# Patient Record
Sex: Female | Born: 1970 | Race: White | Hispanic: No | Smoking: Current every day smoker
Health system: Southern US, Community
[De-identification: ages and names within clinical notes are randomized; demographics above are authoritative.]

## PROBLEM LIST (undated history)

## (undated) DIAGNOSIS — E2839 Other primary ovarian failure: Secondary | ICD-10-CM

## (undated) DIAGNOSIS — N200 Calculus of kidney: Secondary | ICD-10-CM

## (undated) DIAGNOSIS — M810 Age-related osteoporosis without current pathological fracture: Secondary | ICD-10-CM

## (undated) DIAGNOSIS — N390 Urinary tract infection, site not specified: Secondary | ICD-10-CM

## (undated) HISTORY — PX: HERNIA REPAIR: SHX51

## (undated) HISTORY — PX: LITHOTRIPSY: SUR834

---

## 2016-04-20 ENCOUNTER — Emergency Department: Payer: BLUE CROSS/BLUE SHIELD

## 2016-04-20 ENCOUNTER — Emergency Department
Admission: EM | Admit: 2016-04-20 | Discharge: 2016-04-20 | Disposition: A | Discharge status: Home / Self Care | Payer: BLUE CROSS/BLUE SHIELD | Attending: Emergency Medicine | Admitting: Emergency Medicine

## 2016-04-20 DIAGNOSIS — R0781 Pleurodynia: Secondary | ICD-10-CM | POA: Diagnosis not present

## 2016-04-20 DIAGNOSIS — R110 Nausea: Secondary | ICD-10-CM | POA: Diagnosis not present

## 2016-04-20 DIAGNOSIS — F17200 Nicotine dependence, unspecified, uncomplicated: Secondary | ICD-10-CM | POA: Insufficient documentation

## 2016-04-20 DIAGNOSIS — R109 Unspecified abdominal pain: Secondary | ICD-10-CM | POA: Diagnosis present

## 2016-04-20 DIAGNOSIS — R1011 Right upper quadrant pain: Secondary | ICD-10-CM | POA: Diagnosis not present

## 2016-04-20 HISTORY — DX: Age-related osteoporosis without current pathological fracture: M81.0

## 2016-04-20 HISTORY — DX: Other primary ovarian failure: E28.39

## 2016-04-20 HISTORY — DX: Calculus of kidney: N20.0

## 2016-04-20 HISTORY — DX: Urinary tract infection, site not specified: N39.0

## 2016-04-20 LAB — CBC
HCT: 41.7 % (ref 35.0–47.0)
HEMOGLOBIN: 14 g/dL (ref 12.0–16.0)
MCH: 30 pg (ref 26.0–34.0)
MCHC: 33.5 g/dL (ref 32.0–36.0)
MCV: 89.5 fL (ref 80.0–100.0)
Platelets: 188 10*3/uL (ref 150–440)
RBC: 4.66 MIL/uL (ref 3.80–5.20)
RDW: 13.1 % (ref 11.5–14.5)
WBC: 3.9 10*3/uL (ref 3.6–11.0)

## 2016-04-20 LAB — HEPATIC FUNCTION PANEL
ALBUMIN: 4.3 g/dL (ref 3.5–5.0)
ALT: 53 U/L (ref 14–54)
AST: 55 U/L — ABNORMAL HIGH (ref 15–41)
Alkaline Phosphatase: 63 U/L (ref 38–126)
Bilirubin, Direct: <0.1 mg/dL — ABNORMAL LOW (ref 0.1–0.5)
TOTAL PROTEIN: 7.5 g/dL (ref 6.5–8.1)
Total Bilirubin: 0.4 mg/dL (ref 0.3–1.2)

## 2016-04-20 LAB — URINALYSIS, COMPLETE (UACMP) WITH MICROSCOPIC
Bacteria, UA: NONE SEEN
Bilirubin Urine: NEGATIVE
Glucose, UA: NEGATIVE mg/dL
Hgb urine dipstick: NEGATIVE
KETONES UR: 5 mg/dL — AB
Leukocytes, UA: NEGATIVE
Nitrite: NEGATIVE
PROTEIN: NEGATIVE mg/dL
Specific Gravity, Urine: 1.015 (ref 1.005–1.030)
pH: 6 (ref 5.0–8.0)

## 2016-04-20 LAB — BASIC METABOLIC PANEL
ANION GAP: 8 (ref 5–15)
BUN: 6 mg/dL (ref 6–20)
CALCIUM: 9.2 mg/dL (ref 8.9–10.3)
CO2: 27 mmol/L (ref 22–32)
Chloride: 102 mmol/L (ref 101–111)
Creatinine, Ser: 0.78 mg/dL (ref 0.44–1.00)
Glucose, Bld: 135 mg/dL — ABNORMAL HIGH (ref 65–99)
Potassium: 4 mmol/L (ref 3.5–5.1)
SODIUM: 137 mmol/L (ref 135–145)

## 2016-04-20 LAB — LIPASE, BLOOD: Lipase: 37 U/L (ref 11–51)

## 2016-04-20 LAB — PREGNANCY, URINE: Preg Test, Ur: NEGATIVE

## 2016-04-20 MED ORDER — IOPAMIDOL (ISOVUE-370) INJECTION 76%
75.0000 mL | Freq: Once | INTRAVENOUS | Status: AC | PRN
  Administered 2016-04-20: 75 mL via INTRAVENOUS
  Filled 2016-04-20: qty 75

## 2016-04-20 MED ORDER — ONDANSETRON HCL 4 MG/2ML IJ SOLN
4.0000 mg | Freq: Once | INTRAMUSCULAR | Status: AC
  Administered 2016-04-20: 4 mg via INTRAVENOUS
  Filled 2016-04-20: qty 2

## 2016-04-20 MED ORDER — SODIUM CHLORIDE 0.9 % IV BOLUS (SEPSIS)
1000.0000 mL | Freq: Once | INTRAVENOUS | Status: AC
  Administered 2016-04-20: 1000 mL via INTRAVENOUS

## 2016-04-20 MED ORDER — IBUPROFEN 800 MG PO TABS: 800.0000 mg | ORAL_TABLET | Freq: Three times a day (TID) | ORAL | 0 refills | Status: DC | PRN

## 2016-04-20 MED ORDER — KETOROLAC TROMETHAMINE 30 MG/ML IJ SOLN
30.0000 mg | Freq: Once | INTRAMUSCULAR | Status: AC
  Administered 2016-04-20: 30 mg via INTRAVENOUS
  Filled 2016-04-20: qty 1

## 2016-04-20 MED ORDER — CYCLOBENZAPRINE HCL 5 MG PO TABS: 5.0000 mg | ORAL_TABLET | Freq: Three times a day (TID) | ORAL | 0 refills | Status: AC | PRN

## 2016-04-20 MED ORDER — MORPHINE SULFATE (PF) 4 MG/ML IV SOLN
4.0000 mg | Freq: Once | INTRAVENOUS | Status: AC
  Administered 2016-04-20: 4 mg via INTRAVENOUS
  Filled 2016-04-20: qty 1

## 2016-04-20 NOTE — ED Notes (Signed)
Pt discharged home after verbalizing understanding of discharge instructions; nad noted. 

## 2016-04-20 NOTE — ED Provider Notes (Signed)
[facility] Emergency Department Provider Note  ____________________________________________  Time seen: Approximately 3:45 PM  I have reviewed the triage vital signs and the nursing notes.   HISTORY  Chief Complaint Flank Pain   HPI Terry Middleton is a 46 y.o. female with a history of osteoporosis, kidney stones, and ovarian failure who presents for evaluation of right flank pain. Patient reports that the pain started couple days ago but it got worse yesterday. She reports that the pain is dull and constant and it becomes sharp and worse with certain movements of her back. She has tried voltaren gel, Motrin, and ice packs with no improvement at home. She reports history of chronic back pain which is usually lower and not as severe. She reports 1 prior kidney stone requiring lithotripsy. She reports the pain is worse with deep inspiration. She also reports that she had flulike symptoms for the last 4-5 days and today is the first day that she does not have a fever. She reports that her body aches and diarrhea have resolved. She still has a dry cough. She reports nausea. She denies dysuria or hematuria, chest pain or shortness of breath, abdominal pain, personal or family history of blood clots, recent travel or immobilization, hemoptysis, leg pain or swelling, or exogenous hormones.   Past Medical History:  Diagnosis Date  . Kidney stones   . Osteoporosis   . Ovarian failure   . UTI (urinary tract infection)     There are no active problems to display for this patient.   Past Surgical History:  Procedure Laterality Date  . HERNIA REPAIR    . LITHOTRIPSY      Prior to Admission medications   Medication Sig Start Date End Date Taking? Authorizing Provider  cyclobenzaprine (FLEXERIL) 5 MG tablet Take 1 tablet (5 mg total) by mouth 3 (three) times daily as needed for muscle spasms. 04/20/16   Nita Sickle, MD  ibuprofen (ADVIL,MOTRIN) 800 MG tablet Take  1 tablet (800 mg total) by mouth every 8 (eight) hours as needed. 04/20/16   Nita Sickle, MD    Allergies Patient has no known allergies.  No family history on file.  Social History Social History  Substance Use Topics  . Smoking status: Current Every Day Smoker  . Smokeless tobacco: Not on file  . Alcohol use Yes    Review of Systems  Constitutional: Negative for fever. Eyes: Negative for visual changes. ENT: Negative for sore throat. Neck: No neck pain  Cardiovascular: Negative for chest pain. Respiratory: Negative for shortness of breath. Gastrointestinal: Negative for abdominal pain, vomiting or diarrhea. + nausea Genitourinary: Negative for dysuria. + R flank pain Musculoskeletal: Negative for back pain. Skin: Negative for rash. Neurological: Negative for headaches, weakness or numbness. Psych: No SI or HI  ____________________________________________   PHYSICAL EXAM:  VITAL SIGNS: ED Triage Vitals  Enc Vitals Group     BP 04/20/16 1351 122/77     Pulse Rate 04/20/16 1351 (!) 103     Resp 04/20/16 1351 18     Temp 04/20/16 1351 98.2 F (36.8 C)     Temp Source 04/20/16 1351 Oral     SpO2 04/20/16 1351 99 %     Weight 04/20/16 1352 120 lb (54.4 kg)     Height 04/20/16 1352 5\' 6"  (1.676 m)     Head Circumference --      Peak Flow --      Pain Score 04/20/16 1352 8  Pain Loc --      Pain Edu? --      Excl. in GC? --     Constitutional: Alert and oriented. Well appearing and in no apparent distress. HEENT:      Head: Normocephalic and atraumatic.         Eyes: Conjunctivae are normal. Sclera is non-icteric. EOMI. PERRL      Mouth/Throat: Mucous membranes are moist.       Neck: Supple with no signs of meningismus. Cardiovascular: Regular rate and rhythm. No murmurs, gallops, or rubs. 2+ symmetrical distal pulses are present in all extremities. No JVD. Respiratory: Normal respiratory effort. Lungs are clear to auscultation bilaterally. No wheezes,  crackles, or rhonchi.  Gastrointestinal: Soft, mild RUQ tenderness, and non distended with positive bowel sounds. No rebound or guarding. Genitourinary: R CVA tenderness. Musculoskeletal: Pain is reproducible on deep palpation of the R paraspinal region. Nontender with normal range of motion in all extremities. No edema, cyanosis, or erythema of extremities. Neurologic: Normal speech and language. Face is symmetric. Moving all extremities. No gross focal neurologic deficits are appreciated. Skin: Skin is warm, dry and intact. No rash noted. Psychiatric: Mood and affect are normal. Speech and behavior are normal.  ____________________________________________   LABS (all labs ordered are listed, but only abnormal results are displayed)  Labs Reviewed  URINALYSIS, COMPLETE (UACMP) WITH MICROSCOPIC - Abnormal; Notable for the following:       Result Value   Color, Urine YELLOW (*)    APPearance CLEAR (*)    Ketones, ur 5 (*)    Squamous Epithelial / LPF 0-5 (*)    All other components within normal limits  BASIC METABOLIC PANEL - Abnormal; Notable for the following:    Glucose, Bld 135 (*)    All other components within normal limits  HEPATIC FUNCTION PANEL - Abnormal; Notable for the following:    AST 55 (*)    Bilirubin, Direct <0.1 (*)    All other components within normal limits  CBC  LIPASE, BLOOD  PREGNANCY, URINE   ____________________________________________  EKG  none  ____________________________________________  RADIOLOGY  CXR:6 mm nonobstructing stone left lower pole. No other urinary tract Calculi Normal appendix Atherosclerotic disease No acute abnormality.  CTA chest: No pulmonary emboli or other acute vascular pathology.  Mild background emphysema. Probable bronchitis pattern. No consolidation or collapse.  Scattered small subpleural nodules undoubtedly benign.   ____________________________________________   PROCEDURES  Procedure(s) performed:  None Procedures Critical Care performed:  None ____________________________________________   INITIAL IMPRESSION / ASSESSMENT AND PLAN / ED COURSE   46 y.o. female with a history of osteoporosis, kidney stones, and ovarian failure who presents for evaluation of right flank pain worse with movement and associated with nausea. She has mild right flank tenderness but also reproduction of the pain with palpation of the right paraspinal muscles and also has mild right upper quadrant tenderness palpation. Differential diagnoses including MSK pain, kidney stone, gallbladder pathology, RLL PNA. Less likely PE however patient does have mild tachycardia and reports that the pain is pleuritic. Will check UA, CBC, CMP, will send patient for CT renal, CXR. Will give IVF, IV toradol, IV zofran for symptoms relief. If imaging and labs are unreveiling will pursue PE with d-dimer.  Clinical Course as of Apr 20 1802  Wed Apr 20, 2016  1715 Labs and CT renal with no etiology for R upper back pain. CXR concerning for R lung nodule and recommending CT chest however since PE was  also in my differential will get CTA to evaluate this finding of CXR and to rule out PE as possible etiology.  [CV]  1802 CTA negative. Will dc home on supportive care.  [CV]    Clinical Course User Index [CV] Nita Sickle, MD    Pertinent labs & imaging results that were available during my care of the patient were reviewed by me and considered in my medical decision making (see chart for details).    ____________________________________________   FINAL CLINICAL IMPRESSION(S) / ED DIAGNOSES  Final diagnoses:  Right flank pain      NEW MEDICATIONS STARTED DURING THIS VISIT:  New Prescriptions   CYCLOBENZAPRINE (FLEXERIL) 5 MG TABLET    Take 1 tablet (5 mg total) by mouth 3 (three) times daily as needed for muscle spasms.   IBUPROFEN (ADVIL,MOTRIN) 800 MG TABLET    Take 1 tablet (800 mg total) by mouth every 8 (eight)  hours as needed.     Note:  This document was prepared using Dragon voice recognition software and may include unintentional dictation errors.    Nita Sickle, MD 04/20/16 [phone]

## 2016-04-20 NOTE — ED Notes (Signed)
Pt to ct 

## 2016-04-20 NOTE — ED Triage Notes (Signed)
Pt c/o right flank pain for the past week, increased since last night.. Has a hx of kidney stones, but also just got over the flu..Marland Kitchen

## 2016-04-20 NOTE — ED Notes (Signed)
Pt to xray

## 2016-04-20 NOTE — ED Notes (Signed)
Pt presents with right flank pain x 1 week. She states she has prior back injury and has had the flu and has been coughing a lot so she is not sure if it is muscular or kidney stones. She states she has had kidney stones in past and this is not the same. Pt states she has used ice & heat & otc meds with no relief. Pt alert & oriented with obvious pain. She states the pain got much worse last night.

## 2017-02-13 ENCOUNTER — Other Ambulatory Visit: Payer: Self-pay

## 2017-02-13 ENCOUNTER — Emergency Department: Payer: BLUE CROSS/BLUE SHIELD

## 2017-02-13 ENCOUNTER — Encounter: Payer: Self-pay | Admitting: Emergency Medicine

## 2017-02-13 ENCOUNTER — Emergency Department
Admission: EM | Admit: 2017-02-13 | Discharge: 2017-02-13 | Disposition: A | Discharge status: Home / Self Care | Payer: BLUE CROSS/BLUE SHIELD | Attending: Student in an Organized Health Care Education/Training Program | Admitting: Student in an Organized Health Care Education/Training Program

## 2017-02-13 DIAGNOSIS — R079 Chest pain, unspecified: Secondary | ICD-10-CM | POA: Insufficient documentation

## 2017-02-13 DIAGNOSIS — Z79899 Other long term (current) drug therapy: Secondary | ICD-10-CM | POA: Diagnosis not present

## 2017-02-13 DIAGNOSIS — F17200 Nicotine dependence, unspecified, uncomplicated: Secondary | ICD-10-CM | POA: Insufficient documentation

## 2017-02-13 DIAGNOSIS — M545 Low back pain: Secondary | ICD-10-CM | POA: Diagnosis not present

## 2017-02-13 LAB — BASIC METABOLIC PANEL
ANION GAP: 5 (ref 5–15)
BUN: 10 mg/dL (ref 6–20)
CALCIUM: 9.4 mg/dL (ref 8.9–10.3)
CO2: 27 mmol/L (ref 22–32)
CREATININE: 0.76 mg/dL (ref 0.44–1.00)
Chloride: 105 mmol/L (ref 101–111)
GFR calc Af Amer: >60 mL/min (ref >60)
GLUCOSE: 88 mg/dL (ref 65–99)
Potassium: 3.8 mmol/L (ref 3.5–5.1)
Sodium: 137 mmol/L (ref 135–145)

## 2017-02-13 LAB — CBC
HCT: 38.2 % (ref 35.0–47.0)
HEMOGLOBIN: 12.9 g/dL (ref 12.0–16.0)
MCH: 30.9 pg (ref 26.0–34.0)
MCHC: 33.8 g/dL (ref 32.0–36.0)
MCV: 91.4 fL (ref 80.0–100.0)
PLATELETS: 321 10*3/uL (ref 150–440)
RBC: 4.18 MIL/uL (ref 3.80–5.20)
RDW: 13.5 % (ref 11.5–14.5)
WBC: 8.4 10*3/uL (ref 3.6–11.0)

## 2017-02-13 LAB — TROPONIN I

## 2017-02-13 LAB — FIBRIN DERIVATIVES D-DIMER (ARMC ONLY): FIBRIN DERIVATIVES D-DIMER (ARMC): 170.51 ng{FEU}/mL (ref 0.00–499.00)

## 2017-02-13 MED ORDER — CYCLOBENZAPRINE HCL 10 MG PO TABS: 10.0000 mg | ORAL_TABLET | Freq: Three times a day (TID) | ORAL | 0 refills | Status: AC | PRN

## 2017-02-13 MED ORDER — LIDOCAINE 5 % EX PTCH
1.0000 | MEDICATED_PATCH | CUTANEOUS | Status: DC
  Administered 2017-02-13: 1 via TRANSDERMAL
  Filled 2017-02-13: qty 1

## 2017-02-13 MED ORDER — LIDOCAINE 5 % EX PTCH: 1.0000 | MEDICATED_PATCH | Freq: Two times a day (BID) | CUTANEOUS | 0 refills | Status: AC

## 2017-02-13 MED ORDER — HYDROCODONE-ACETAMINOPHEN 5-325 MG PO TABS
2.0000 | ORAL_TABLET | Freq: Once | ORAL | Status: AC
  Administered 2017-02-13: 2 via ORAL
  Filled 2017-02-13: qty 2

## 2017-02-13 MED ORDER — HYDROCODONE-ACETAMINOPHEN 5-325 MG PO TABS: 1.0000 | ORAL_TABLET | ORAL | 0 refills | Status: DC | PRN

## 2017-02-13 NOTE — ED Triage Notes (Signed)
Pt c/o left scapula/axilla/chest pain starting yesterday. Has had lower back pain for month which is seeing chiropractor for.  Reports left chest pain is constant and achy then gets sharp pains intermittently. No new cough same smoker cough but hurts worse to cough and breath. Unlabored in triage, VSS.

## 2017-02-13 NOTE — ED Provider Notes (Signed)
[facility] Emergency Department Provider Note    First MD Initiated Contact with Patient 02/13/17 1443     (approximate)  I have reviewed the triage vital signs and the nursing notes.   HISTORY  Chief Complaint Chest Pain    HPI Terry Middleton is a 46 y.o. female with a history of osteoporosis but no recent hospitalizations presents with a chief complaint of several weeks to months of chest pain and low back pain.  Patient went to her chiropractor late last week to have a manipulation performed.  On Saturday was complaining of severe left-sided chest pain radiating to her back and scapula.  States that it is worse with movement.  Describes it as severe burning and stabbing pain to the left side of her chest.  No cough but it is worse taking deep inspiration.  No history of blood clots.  She does smoke.  Not on any blood thinners.  No previous history of blood clots.  Past Medical History:  Diagnosis Date  . Kidney stones   . Osteoporosis   . Ovarian failure   . UTI (urinary tract infection)    History reviewed. No pertinent family history. Past Surgical History:  Procedure Laterality Date  . HERNIA REPAIR    . LITHOTRIPSY     There are no active problems to display for this patient.     Prior to Admission medications   Medication Sig Start Date End Date Taking? Authorizing Provider  cyclobenzaprine (FLEXERIL) 5 MG tablet Take 1 tablet (5 mg total) by mouth 3 (three) times daily as needed for muscle spasms. 04/20/16   Nita Sickle, MD  ibuprofen (ADVIL,MOTRIN) 800 MG tablet Take 1 tablet (800 mg total) by mouth every 8 (eight) hours as needed. 04/20/16   Nita Sickle, MD    Allergies Sulfa antibiotics    Social History Social History   Tobacco Use  . Smoking status: Current Every Day Smoker  . Smokeless tobacco: Never Used  Substance Use Topics  . Alcohol use: Yes  . Drug use: No    Review of Systems Patient denies  headaches, rhinorrhea, blurry vision, numbness, shortness of breath, chest pain, edema, cough, abdominal pain, nausea, vomiting, diarrhea, dysuria, fevers, rashes or hallucinations unless otherwise stated above in HPI. ____________________________________________   PHYSICAL EXAM:  VITAL SIGNS: Vitals:   02/13/17 1330  BP: 114/83  Pulse: 81  Resp: 18  Temp: 98.2 F (36.8 C)  SpO2: 99%    Constitutional: Alert and oriented. in no acute distress. Eyes: Conjunctivae are normal.  Head: Atraumatic. Nose: No congestion/rhinnorhea. Mouth/Throat: Mucous membranes are moist.   Neck: No stridor. Painless ROM.  Cardiovascular: Normal rate, regular rhythm. Grossly normal heart sounds.  Good peripheral circulation. Respiratory: Normal respiratory effort.  No retractions. Lungs CTAB. Gastrointestinal: Soft and nontender. No distention. No abdominal bruits. No CVA tenderness. Genitourinary:  Musculoskeletal: Tenderness palpation of the left hemithorax.  No crepitus or deformities no lower extremity tenderness nor edema.  No joint effusions. Neurologic:  Normal speech and language. No gross focal neurologic deficits are appreciated. No facial droop Skin:  Skin is warm, dry and intact. No rash noted. Psychiatric: Mood and affect are normal. Speech and behavior are normal.  ____________________________________________   LABS (all labs ordered are listed, but only abnormal results are displayed)  Results for orders placed or performed during the hospital encounter of 02/13/17 (from the past 24 hour(s))  Basic metabolic panel     Status: None   Collection Time:  02/13/17  1:29 PM  Result Value Ref Range   Sodium 137 135 - 145 mmol/L   Potassium 3.8 3.5 - 5.1 mmol/L   Chloride 105 101 - 111 mmol/L   CO2 27 22 - 32 mmol/L   Glucose, Bld 88 65 - 99 mg/dL   BUN 10 6 - 20 mg/dL   Creatinine, Ser 6.040.76 0.44 - 1.00 mg/dL   Calcium 9.4 8.9 - 54.010.3 mg/dL   GFR calc non Af Amer >60 >60 mL/min   GFR  calc Af Amer >60 >60 mL/min   Anion gap 5 5 - 15  CBC     Status: None   Collection Time: 02/13/17  1:29 PM  Result Value Ref Range   WBC 8.4 3.6 - 11.0 K/uL   RBC 4.18 3.80 - 5.20 MIL/uL   Hemoglobin 12.9 12.0 - 16.0 g/dL   HCT 98.138.2 19.135.0 - 47.847.0 %   MCV 91.4 80.0 - 100.0 fL   MCH 30.9 26.0 - 34.0 pg   MCHC 33.8 32.0 - 36.0 g/dL   RDW 29.513.5 62.111.5 - 30.814.5 %   Platelets 321 150 - 440 K/uL  Troponin I     Status: None   Collection Time: 02/13/17  1:29 PM  Result Value Ref Range   Troponin I <0.03 <0.03 ng/mL  Fibrin derivatives D-Dimer ([facility] only)     Status: None   Collection Time: 02/13/17  3:34 PM  Result Value Ref Range   Fibrin derivatives D-dimer ([facility]) 170.51 0.00 - 499.00 ng/mL (FEU)   ____________________________________________  EKG My review and personal interpretation at Time: 13:26   Indication: chest pain  Rate: 85  Rhythm: sinus Axis: normal Other: normal intervals, no stemi, no ischemic changes ____________________________________________  RADIOLOGY  I personally reviewed all radiographic images ordered to evaluate for the above acute complaints and reviewed radiology reports and findings.  These findings were personally discussed with the patient.  Please see medical record for radiology report.  ____________________________________________   PROCEDURES  Procedure(s) performed:  Procedures    Critical Care performed: no ____________________________________________   INITIAL IMPRESSION / ASSESSMENT AND PLAN / ED COURSE  Pertinent labs & imaging results that were available during my care of the patient were reviewed by me and considered in my medical decision making (see chart for details).  DDX: ACS, pericarditis, esophagitis, boerhaaves, pe, dissection, pna, bronchitis, costochondritis   Terry Middleton is a 46 y.o. who presents to the ED with times as described above.  Not clinically consistent with ACS or dissection.  Patient does describe pleuritic  chest pain and is low risk by [place] given her pain and the fact that she is also concern for pulmonary embolism will order d-dimer to further risk stratify.  Most likely this is consistent with muscular skeletal strain possible displaced rib fracture.  The patient will be placed on continuous pulse oximetry and telemetry for monitoring.  Laboratory evaluation will be sent to evaluate for the above complaints.       Patient reassessed.  Pain has improved.  No respiratory distress.  D-dimer is negative.  This not clinically consistent with pulmonary embolism or ACS.  Will treat as occult rib fracture with topical and oral analgesics.  Will provide incentive spirometer.  Patient otherwise stable and appropriate for further workup as an outpatient.  Just strict return precautions.  Have discussed with the patient and available family all diagnostics and treatments performed thus far and all questions were answered to the best of my ability. The  patient demonstrates understanding and agreement with plan.   ____________________________________________   FINAL CLINICAL IMPRESSION(S) / ED DIAGNOSES  Final diagnoses:  Chest pain, unspecified type      NEW MEDICATIONS STARTED DURING THIS VISIT:  This SmartLink is deprecated. Use AVSMEDLIST instead to display the medication list for a patient.   Note:  This document was prepared using Dragon voice recognition software and may include unintentional dictation errors.    Corrigan Kretschmer, PaWilly Eddytrick, MD 02/13/17 [phone]

## 2019-01-28 IMAGING — CT CT RENAL STONE PROTOCOL
3 of 4 series · 8 of 46 positions shown, 15 images · non-contrast
Comparison: None.

CLINICAL DATA: Right-sided flank pain

EXAM:
CT ABDOMEN AND PELVIS WITHOUT CONTRAST
TECHNIQUE: Multidetector CT imaging of the abdomen and pelvis was performed
following the standard protocol without IV contrast.

[Series 4: lung bases · axial · 0.67mm/px · z∈[-545,-490]mm · 4 of 19 slices shown, 9 images]
[im 4/19  soft-tissue]
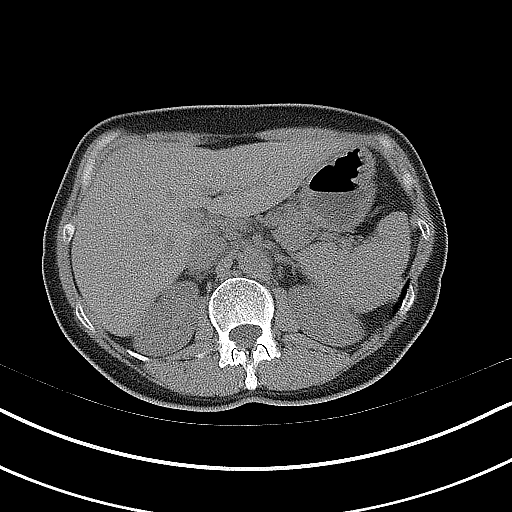
[im 4/19  lung]
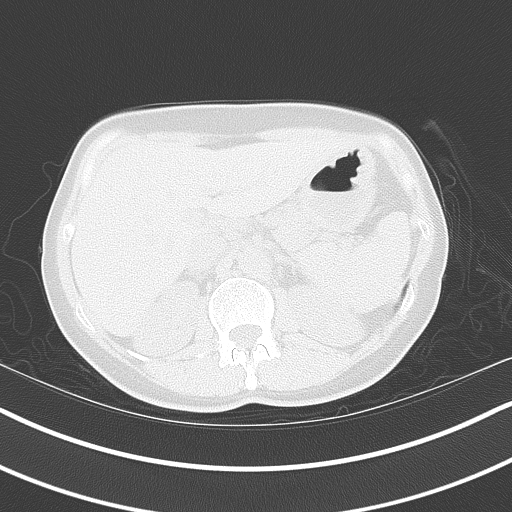
[im 4/19  bone]
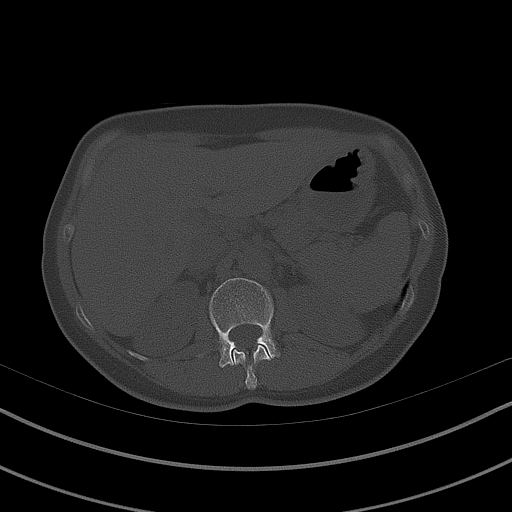
[im 8/19  soft-tissue]
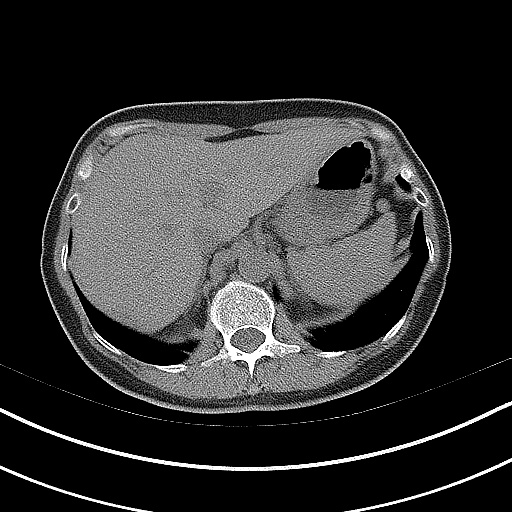
[im 8/19  lung]
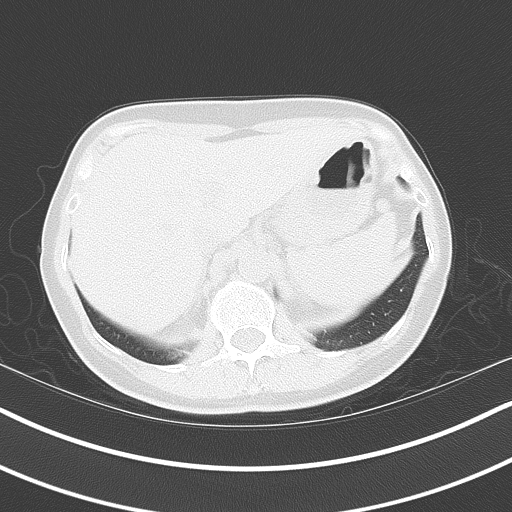
[im 11/19  soft-tissue]
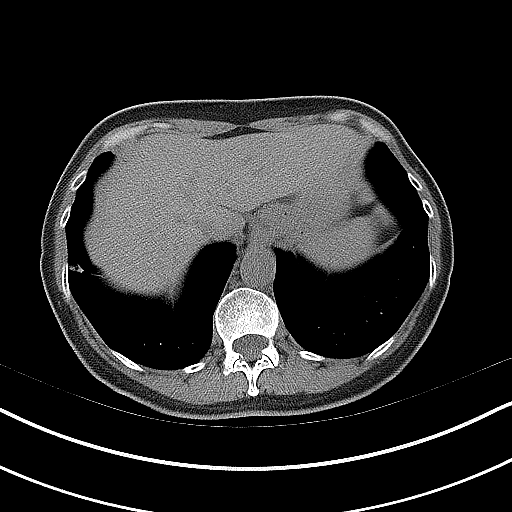
[im 11/19  lung]
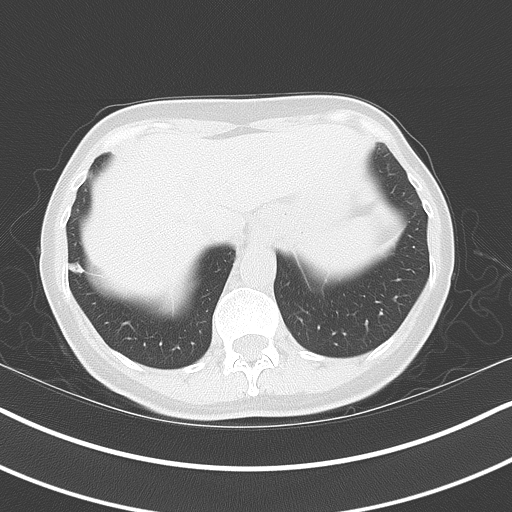
[im 15/19  soft-tissue]
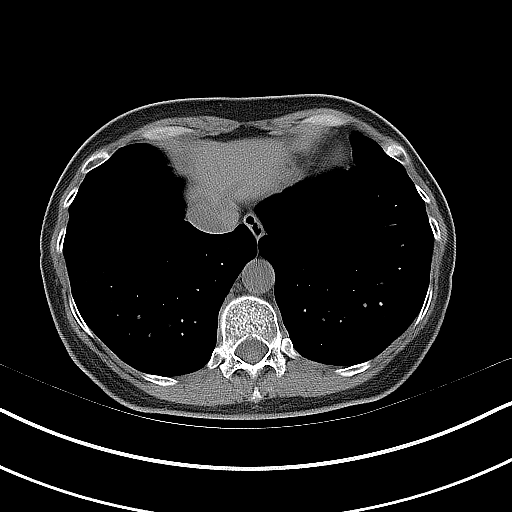
[im 15/19  lung]
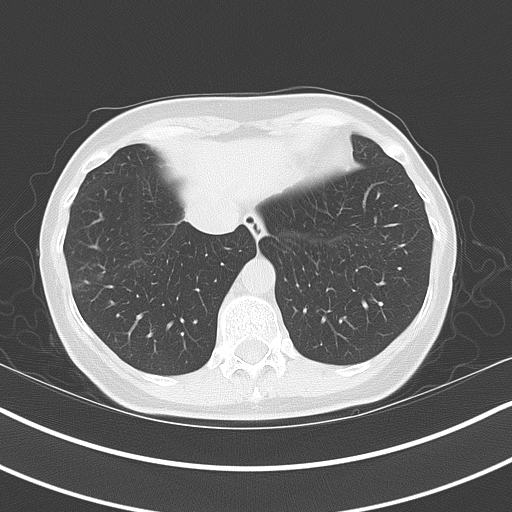

[Series 5: coronal · coronal · 0.74mm/px · 3 of 100 slices shown, 4 images]
[im 34/100  soft-tissue]
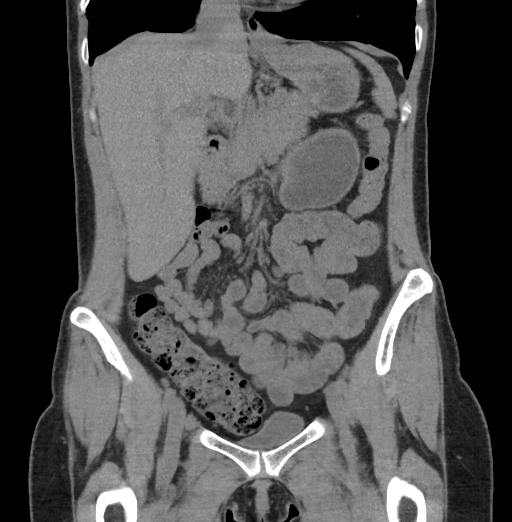
[im 45/100  soft-tissue]
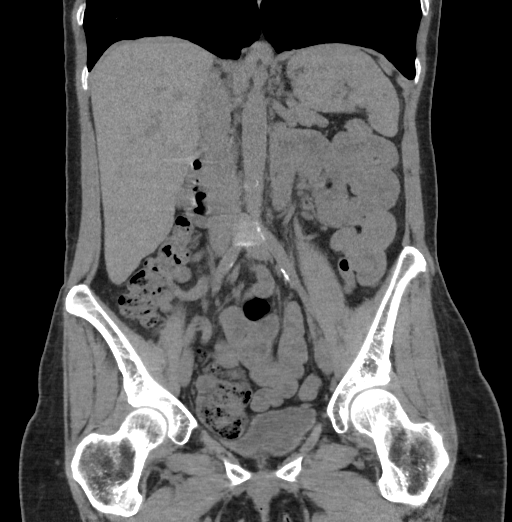
[im 45/100  bone]
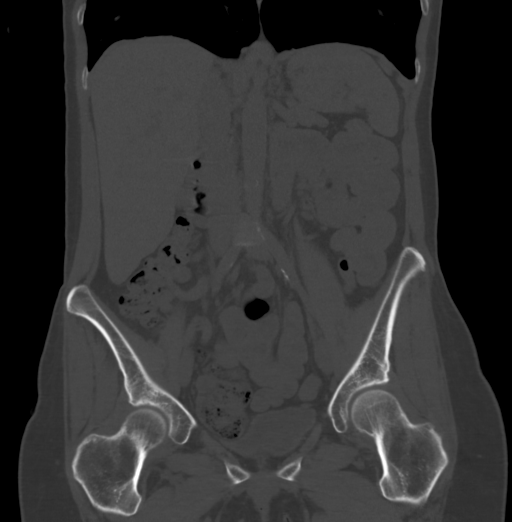
[im 56/100  soft-tissue]
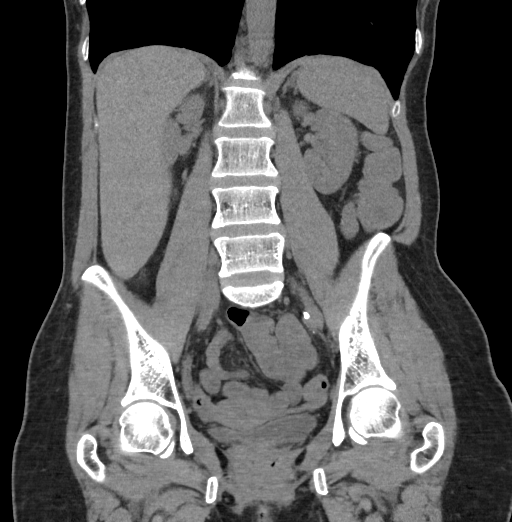

[Series 6: sagittal · sagittal · 0.50mm/px · 1 of 151 slices shown, 2 images]
[im 51/151  soft-tissue]
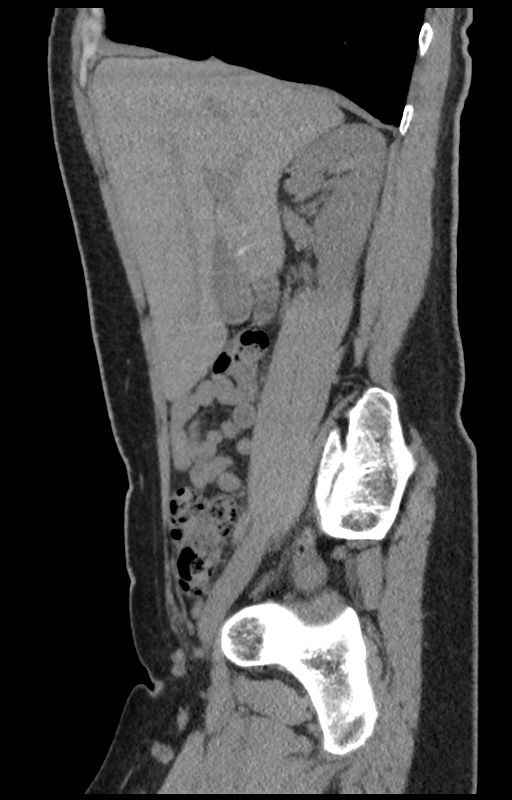
[im 51/151  bone]
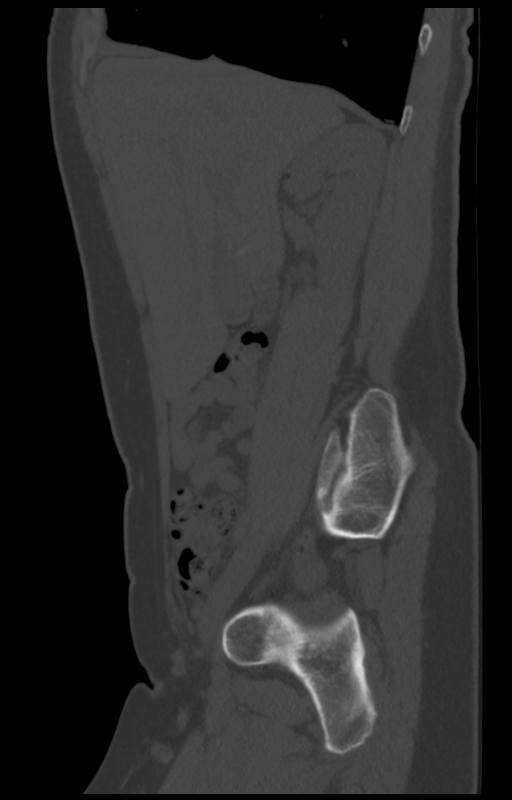

[8 of 46 positions shown; findings below may reference images not displayed]

FINDINGS: Lower chest: Mild linear scarring in the right lung base. No
infiltrate or effusion. Heart size within normal limits.

Hepatobiliary: Probable small gallstones. No gallbladder wall
thickening. Liver and bile ducts normal.

Pancreas: Negative

Spleen: Negative

Adrenals/Urinary Tract: 6 mm nonobstructing stone in the left lower
pole. No other renal calculi. No renal obstruction or mass. Bladder
normal.

Stomach/Bowel: Small hiatal hernia. Negative for bowel obstruction.
No bowel mass or edema. Normal appendix. Negative for
diverticulitis. Mild sigmoid diverticulosis.

Vascular/Lymphatic: Atherosclerotic calcification aorta and iliac
arteries. No aneurysm. Negative for lymphadenopathy.

Reproductive: Uterus normal in size.  No pelvic mass.

Other: Negative for free fluid.

Musculoskeletal: Negative
IMPRESSION: 6 mm nonobstructing stone left lower pole. No other urinary tract
calculi

Normal appendix

Atherosclerotic disease

No acute abnormality.

## 2021-02-15 ENCOUNTER — Emergency Department: Payer: Self-pay

## 2021-02-15 ENCOUNTER — Other Ambulatory Visit: Payer: Self-pay

## 2021-02-15 ENCOUNTER — Emergency Department
Admission: EM | Admit: 2021-02-15 | Discharge: 2021-02-15 | Disposition: A | Discharge status: Home / Self Care | Payer: Self-pay | Attending: Emergency Medicine | Admitting: Emergency Medicine

## 2021-02-15 DIAGNOSIS — N200 Calculus of kidney: Secondary | ICD-10-CM | POA: Insufficient documentation

## 2021-02-15 DIAGNOSIS — F17200 Nicotine dependence, unspecified, uncomplicated: Secondary | ICD-10-CM | POA: Insufficient documentation

## 2021-02-15 DIAGNOSIS — R1033 Periumbilical pain: Secondary | ICD-10-CM

## 2021-02-15 LAB — COMPREHENSIVE METABOLIC PANEL
ALT: 12 U/L (ref 0–44)
AST: 16 U/L (ref 15–41)
Albumin: 4 g/dL (ref 3.5–5.0)
Alkaline Phosphatase: 56 U/L (ref 38–126)
Anion gap: 6 (ref 5–15)
BUN: 10 mg/dL (ref 6–20)
CO2: 24 mmol/L (ref 22–32)
Calcium: 9.2 mg/dL (ref 8.9–10.3)
Chloride: 106 mmol/L (ref 98–111)
Creatinine, Ser: 0.78 mg/dL (ref 0.44–1.00)
GFR, Estimated: >60 mL/min (ref >60)
Glucose, Bld: 121 mg/dL — ABNORMAL HIGH (ref 70–99)
Potassium: 3.9 mmol/L (ref 3.5–5.1)
Sodium: 136 mmol/L (ref 135–145)
Total Bilirubin: 0.5 mg/dL (ref 0.3–1.2)
Total Protein: 6.6 g/dL (ref 6.5–8.1)

## 2021-02-15 LAB — URINALYSIS, ROUTINE W REFLEX MICROSCOPIC
Bilirubin Urine: NEGATIVE
Glucose, UA: NEGATIVE mg/dL
Hgb urine dipstick: NEGATIVE
Ketones, ur: NEGATIVE mg/dL
Leukocytes,Ua: NEGATIVE
Nitrite: NEGATIVE
Protein, ur: NEGATIVE mg/dL
Specific Gravity, Urine: >1.046 — ABNORMAL HIGH (ref 1.005–1.030)
pH: 7 (ref 5.0–8.0)

## 2021-02-15 LAB — LIPASE, BLOOD: Lipase: 45 U/L (ref 11–51)

## 2021-02-15 LAB — CBC
HCT: 35.6 % — ABNORMAL LOW (ref 36.0–46.0)
Hemoglobin: 12.1 g/dL (ref 12.0–15.0)
MCH: 31.5 pg (ref 26.0–34.0)
MCHC: 34 g/dL (ref 30.0–36.0)
MCV: 92.7 fL (ref 80.0–100.0)
Platelets: 326 10*3/uL (ref 150–400)
RBC: 3.84 MIL/uL — ABNORMAL LOW (ref 3.87–5.11)
RDW: 12.5 % (ref 11.5–15.5)
WBC: 6.7 10*3/uL (ref 4.0–10.5)
nRBC: 0 % (ref 0.0–0.2)

## 2021-02-15 LAB — TROPONIN I (HIGH SENSITIVITY): Troponin I (High Sensitivity): 3 ng/L (ref <18)

## 2021-02-15 MED ORDER — IOHEXOL 300 MG/ML  SOLN
80.0000 mL | Freq: Once | INTRAMUSCULAR | Status: AC | PRN
  Administered 2021-02-15: 80 mL via INTRAVENOUS

## 2021-02-15 MED ORDER — ONDANSETRON HCL 4 MG/2ML IJ SOLN
4.0000 mg | Freq: Once | INTRAMUSCULAR | Status: AC
Start: 2021-02-15 — End: 2021-02-15
  Administered 2021-02-15: 4 mg via INTRAVENOUS
  Filled 2021-02-15: qty 2

## 2021-02-15 MED ORDER — ONDANSETRON 4 MG PO TBDP: 4.0000 mg | ORAL_TABLET | Freq: Three times a day (TID) | ORAL | 0 refills | Status: AC | PRN

## 2021-02-15 MED ORDER — KETOROLAC TROMETHAMINE 30 MG/ML IJ SOLN
15.0000 mg | Freq: Once | INTRAMUSCULAR | Status: AC
  Administered 2021-02-15: 15 mg via INTRAVENOUS
  Filled 2021-02-15: qty 1

## 2021-02-15 MED ORDER — HYDROCODONE-ACETAMINOPHEN 5-325 MG PO TABS: 1.0000 | ORAL_TABLET | Freq: Four times a day (QID) | ORAL | 0 refills | Status: AC | PRN

## 2021-02-15 MED ORDER — ONDANSETRON HCL 4 MG/2ML IJ SOLN
4.0000 mg | Freq: Once | INTRAMUSCULAR | Status: AC
  Administered 2021-02-15: 4 mg via INTRAVENOUS
  Filled 2021-02-15: qty 2

## 2021-02-15 MED ORDER — MORPHINE SULFATE (PF) 4 MG/ML IV SOLN
4.0000 mg | Freq: Once | INTRAVENOUS | Status: AC
  Administered 2021-02-15: 4 mg via INTRAVENOUS
  Filled 2021-02-15: qty 1

## 2021-02-15 MED ORDER — IBUPROFEN 600 MG PO TABS
600.0000 mg | ORAL_TABLET | Freq: Four times a day (QID) | ORAL | 0 refills | Status: AC | PRN
Start: 2021-02-15 — End: ?

## 2021-02-15 NOTE — ED Triage Notes (Signed)
Pt c/o waking with lower back pain around 4am , states since the past pain has moved to the mid abd with N/V. States she had a normal BM this morning.

## 2021-02-15 NOTE — ED Notes (Signed)
Pt ambulatory to restroom for urine sample. Provided crackers and ice water for PO challenge.

## 2021-02-15 NOTE — ED Provider Notes (Signed)
[facility] Emergency Department Provider Note  ____________________________________________   Event Date/Time   First MD Initiated Contact with Patient 02/15/21 1007     (approximate)  I have reviewed the triage vital signs and the nursing notes.   HISTORY  Chief Complaint Abdominal Pain    HPI Terry Middleton is a 50 y.o. female with past medical history as below here with severe abdominal pain.  The patient states that this afternoon, she experienced acute onset of fairly severe midline periumbilical pain.  Pain is aching, throbbing, and severe.  It was associated with nausea.  No diarrhea or constipation.  No history of similar symptoms.  She denies any associated vaginal bleeding or discharge.  No urinary symptoms.  She has history of kidney stones but states the current pain was more in the middle abdomen rather than the flank.  No known sick contacts.  No recent medication changes.    Past Medical History:  Diagnosis Date   Kidney stones    Osteoporosis    Ovarian failure    UTI (urinary tract infection)     There are no problems to display for this patient.   Past Surgical History:  Procedure Laterality Date   HERNIA REPAIR     LITHOTRIPSY      Prior to Admission medications   Medication Sig Start Date End Date Taking? Authorizing Provider  HYDROcodone-acetaminophen (NORCO/VICODIN) 5-325 MG tablet Take 1 tablet by mouth every 6 (six) hours as needed for severe pain. 02/15/21 02/15/22 Yes Duffy Bruce, MD  ibuprofen (ADVIL) 600 MG tablet Take 1 tablet (600 mg total) by mouth every 6 (six) hours as needed for moderate pain. 02/15/21  Yes Duffy Bruce, MD  ondansetron (ZOFRAN-ODT) 4 MG disintegrating tablet Take 1 tablet (4 mg total) by mouth every 8 (eight) hours as needed for nausea or vomiting. 02/15/21  Yes Duffy Bruce, MD  cyclobenzaprine (FLEXERIL) 10 MG tablet Take 1 tablet (10 mg total) by mouth 3 (three) times daily as needed  for muscle spasms. 02/13/17   Merlyn Lot, MD  cyclobenzaprine (FLEXERIL) 5 MG tablet Take 1 tablet (5 mg total) by mouth 3 (three) times daily as needed for muscle spasms. Patient not taking: Reported on 02/13/2017 04/20/16   Rudene Re, MD    Allergies Sulfa antibiotics  No family history on file.  Social History Social History   Tobacco Use   Smoking status: Every Day   Smokeless tobacco: Never  Substance Use Topics   Alcohol use: Yes   Drug use: No    Review of Systems  Review of Systems  Constitutional:  Positive for fatigue. Negative for fever.  HENT:  Negative for congestion and sore throat.   Eyes:  Negative for visual disturbance.  Respiratory:  Negative for cough and shortness of breath.   Cardiovascular:  Negative for chest pain.  Gastrointestinal:  Positive for abdominal pain and nausea. Negative for diarrhea and vomiting.  Genitourinary:  Negative for flank pain.  Musculoskeletal:  Negative for back pain and neck pain.  Skin:  Negative for rash and wound.  Neurological:  Negative for weakness.    ____________________________________________  PHYSICAL EXAM:      VITAL SIGNS: ED Triage Vitals  Enc Vitals Group     BP 02/15/21 0932 (!) 146/116     Pulse Rate 02/15/21 0932 62     Resp 02/15/21 0932 18     Temp 02/15/21 0934 97.6 F (36.4 C)     Temp Source 02/15/21 0932  Oral     SpO2 02/15/21 0932 100 %     Weight 02/15/21 0932 110 lb (49.9 kg)     Height 02/15/21 0932 5\' 7"  (1.702 m)     Head Circumference --      Peak Flow --      Pain Score 02/15/21 0932 4     Pain Loc --      Pain Edu? --      Excl. in GC? --      Physical Exam Vitals and nursing note reviewed.  Constitutional:      General: She is not in acute distress.    Appearance: She is well-developed.  HENT:     Head: Normocephalic and atraumatic.  Eyes:     Conjunctiva/sclera: Conjunctivae normal.  Cardiovascular:     Rate and Rhythm: Normal rate and regular  rhythm.     Heart sounds: Normal heart sounds. No murmur heard.   No friction rub.  Pulmonary:     Effort: Pulmonary effort is normal. No respiratory distress.     Breath sounds: Normal breath sounds. No wheezing or rales.  Abdominal:     General: There is no distension.     Palpations: Abdomen is soft.     Tenderness: There is abdominal tenderness in the periumbilical area.  Musculoskeletal:     Cervical back: Neck supple.  Skin:    General: Skin is warm.     Capillary Refill: Capillary refill takes less than 2 seconds.  Neurological:     Mental Status: She is alert and oriented to person, place, and time.     Motor: No abnormal muscle tone.      ____________________________________________   LABS (all labs ordered are listed, but only abnormal results are displayed)  Labs Reviewed  COMPREHENSIVE METABOLIC PANEL - Abnormal; Notable for the following components:      Result Value   Glucose, Bld 121 (*)    All other components within normal limits  CBC - Abnormal; Notable for the following components:   RBC 3.84 (*)    HCT 35.6 (*)    All other components within normal limits  URINALYSIS, ROUTINE W REFLEX MICROSCOPIC - Abnormal; Notable for the following components:   Color, Urine YELLOW (*)    APPearance CLOUDY (*)    Specific Gravity, Urine >1.046 (*)    All other components within normal limits  LIPASE, BLOOD  POC URINE PREG, ED  TROPONIN I (HIGH SENSITIVITY)    ____________________________________________  EKG: Sinus bradycardia, ventricular rate 55.  PR 170, QRS 84, QTc 397.  No acute ST elevations or depressions. ________________________________________  RADIOLOGY All imaging, including plain films, CT scans, and ultrasounds, independently reviewed by me, and interpretations confirmed via formal radiology reads.  ED MD interpretation:   CT abdomen/pelvis with contrast: 5 mm nonobstructing left renal calculus  Official radiology report(s): CT ABDOMEN  PELVIS W CONTRAST  Result Date: 02/15/2021 CLINICAL DATA:  Abdominal pain, acute, nonlocalized EXAM: CT ABDOMEN AND PELVIS WITH CONTRAST TECHNIQUE: Multidetector CT imaging of the abdomen and pelvis was performed using the standard protocol following bolus administration of intravenous contrast. CONTRAST:  23mL OMNIPAQUE IOHEXOL 300 MG/ML  SOLN COMPARISON:  None. FINDINGS: Lower chest: No acute abnormality. Hepatobiliary: No focal liver abnormality is seen. No gallstones, gallbladder wall thickening, or biliary dilatation. Pancreas: Unremarkable. No pancreatic ductal dilatation or surrounding inflammatory changes. Spleen: Normal in size without focal abnormality. Adrenals/Urinary Tract: Adrenals are unremarkable. Small right renal cyst. 5 mm nonobstructing calculus of  the lower pole the left kidney. Bladder is unremarkable. Stomach/Bowel: Stomach is within normal limits. Bowel is normal in caliber. Normal appendix. Vascular/Lymphatic: Atherosclerosis.  No enlarged lymph nodes. Reproductive: Uterus and bilateral adnexa are unremarkable. Other: No free fluid.  Abdominal wall is unremarkable. Musculoskeletal: No acute osseous abnormality. IMPRESSION: 5 mm nonobstructing left renal calculus. Aortic Atherosclerosis (ICD10-I70.0). Electronically Signed   By: Guadlupe Spanish M.D.   On: 02/15/2021 12:27    ____________________________________________  PROCEDURES   Procedure(s) performed (including Critical Care):  Procedures  ____________________________________________  INITIAL IMPRESSION / MDM / ASSESSMENT AND PLAN / ED COURSE  As part of my medical decision making, I reviewed the following data within the electronic MEDICAL RECORD NUMBER Nursing notes reviewed and incorporated, Old chart reviewed, Notes from prior ED visits, and [place] Controlled Substance Database       *Katti Pelle was evaluated in Emergency Department on 02/15/2021 for the symptoms described in the history of present illness. She was  evaluated in the context of the global COVID-19 pandemic, which necessitated consideration that the patient might be at risk for infection with the SARS-CoV-2 virus that causes COVID-19. Institutional protocols and algorithms that pertain to the evaluation of patients at risk for COVID-19 are in a state of rapid change based on information released by regulatory bodies including the CDC and federal and state organizations. These policies and algorithms were followed during the patient's care in the ED.  Some ED evaluations and interventions may be delayed as a result of limited staffing during the pandemic.*     Medical Decision Making: 50 year old female here with acute onset of periumbilical pain.  Differential is broad, includes gastritis/PUD, irritable bowel, less likely cholecystitis or appendicitis, but will obtain CT scan.  Denies urinary symptoms but renal stone also consideration given the acuity of onset and history of somewhat similar presentations.  Denies any dysuria or signs of UTI.  Vital signs are stable.  Labs reviewed and are overall very reassuring.  No leukocytosis or anemia.  CMP with normal LFTs, renal function.  Lipase is normal.  Urinalysis with dehydration but otherwise unremarkable.  CT scan does show 5 mm left renal stone, but otherwise is unremarkable.  No AAA or signs of vascular pathology.  Suspect possible IBS versus pain related to stone.  Pain is essentially resolved with medicine in the ED.  She is otherwise hemodynamically stable and well-appearing.  Will discharge with supportive care and outpatient follow-up.  ____________________________________________  FINAL CLINICAL IMPRESSION(S) / ED DIAGNOSES  Final diagnoses:  Periumbilical abdominal pain  Kidney stone     MEDICATIONS GIVEN DURING THIS VISIT:  Medications  morphine 4 MG/ML injection 4 mg (4 mg Intravenous Given 02/15/21 1116)  ondansetron (ZOFRAN) injection 4 mg (4 mg Intravenous Given 02/15/21 1117)   iohexol (OMNIPAQUE) 300 MG/ML solution 80 mL (80 mLs Intravenous Contrast Given 02/15/21 1203)  ketorolac (TORADOL) 30 MG/ML injection 15 mg (15 mg Intravenous Given 02/15/21 1331)  ondansetron (ZOFRAN) injection 4 mg (4 mg Intravenous Given 02/15/21 1331)     ED Discharge Orders          Ordered    HYDROcodone-acetaminophen (NORCO/VICODIN) 5-325 MG tablet  Every 6 hours PRN        02/15/21 1407    ondansetron (ZOFRAN-ODT) 4 MG disintegrating tablet  Every 8 hours PRN        02/15/21 1407    ibuprofen (ADVIL) 600 MG tablet  Every 6 hours PRN        02/15/21  1407             Note:  This document was prepared using Dragon voice recognition software and may include unintentional dictation errors.   Duffy Bruce, MD 02/15/21 [phone]

## 2021-02-15 NOTE — ED Notes (Signed)
Pt reports centralized abd pain that started this am. Nausea, emesis x1. NAD noted

## 2021-02-15 NOTE — ED Notes (Signed)
Lab contacted to add on trop 

## 2022-06-22 DIAGNOSIS — F331 Major depressive disorder, recurrent, moderate: Secondary | ICD-10-CM | POA: Diagnosis not present

## 2022-06-22 DIAGNOSIS — Z1331 Encounter for screening for depression: Secondary | ICD-10-CM | POA: Diagnosis not present

## 2022-06-22 DIAGNOSIS — F3489 Other specified persistent mood disorders: Secondary | ICD-10-CM | POA: Diagnosis not present

## 2022-06-22 DIAGNOSIS — Z79899 Other long term (current) drug therapy: Secondary | ICD-10-CM | POA: Diagnosis not present

## 2022-06-22 DIAGNOSIS — Z1330 Encounter for screening examination for mental health and behavioral disorders, unspecified: Secondary | ICD-10-CM | POA: Diagnosis not present

## 2022-06-22 DIAGNOSIS — F439 Reaction to severe stress, unspecified: Secondary | ICD-10-CM | POA: Diagnosis not present

## 2022-06-22 DIAGNOSIS — F411 Generalized anxiety disorder: Secondary | ICD-10-CM | POA: Diagnosis not present

## 2022-07-12 DIAGNOSIS — Z0000 Encounter for general adult medical examination without abnormal findings: Secondary | ICD-10-CM | POA: Diagnosis not present

## 2022-07-12 DIAGNOSIS — F331 Major depressive disorder, recurrent, moderate: Secondary | ICD-10-CM | POA: Diagnosis not present

## 2022-07-12 DIAGNOSIS — Z23 Encounter for immunization: Secondary | ICD-10-CM | POA: Diagnosis not present

## 2022-07-12 DIAGNOSIS — R7303 Prediabetes: Secondary | ICD-10-CM | POA: Diagnosis not present

## 2022-07-12 DIAGNOSIS — R45851 Suicidal ideations: Secondary | ICD-10-CM | POA: Diagnosis not present

## 2022-07-12 DIAGNOSIS — Z87410 Personal history of cervical dysplasia: Secondary | ICD-10-CM | POA: Diagnosis not present

## 2022-07-12 DIAGNOSIS — F32A Depression, unspecified: Secondary | ICD-10-CM | POA: Diagnosis not present

## 2022-07-12 DIAGNOSIS — R87613 High grade squamous intraepithelial lesion on cytologic smear of cervix (HGSIL): Secondary | ICD-10-CM | POA: Diagnosis not present

## 2022-07-12 DIAGNOSIS — Z124 Encounter for screening for malignant neoplasm of cervix: Secondary | ICD-10-CM | POA: Diagnosis not present

## 2022-07-12 DIAGNOSIS — R87612 Low grade squamous intraepithelial lesion on cytologic smear of cervix (LGSIL): Secondary | ICD-10-CM | POA: Diagnosis not present

## 2022-07-12 DIAGNOSIS — Z1231 Encounter for screening mammogram for malignant neoplasm of breast: Secondary | ICD-10-CM | POA: Diagnosis not present

## 2022-07-12 DIAGNOSIS — Z1211 Encounter for screening for malignant neoplasm of colon: Secondary | ICD-10-CM | POA: Diagnosis not present

## 2022-07-12 DIAGNOSIS — F411 Generalized anxiety disorder: Secondary | ICD-10-CM | POA: Diagnosis not present

## 2022-07-12 DIAGNOSIS — A630 Anogenital (venereal) warts: Secondary | ICD-10-CM | POA: Diagnosis not present

## 2022-07-28 DIAGNOSIS — Z8742 Personal history of other diseases of the female genital tract: Secondary | ICD-10-CM | POA: Diagnosis not present

## 2022-07-28 DIAGNOSIS — Z113 Encounter for screening for infections with a predominantly sexual mode of transmission: Secondary | ICD-10-CM | POA: Diagnosis not present

## 2022-07-28 DIAGNOSIS — R87612 Low grade squamous intraepithelial lesion on cytologic smear of cervix (LGSIL): Secondary | ICD-10-CM | POA: Diagnosis not present

## 2022-08-17 DIAGNOSIS — F411 Generalized anxiety disorder: Secondary | ICD-10-CM | POA: Diagnosis not present

## 2022-08-17 DIAGNOSIS — Z1211 Encounter for screening for malignant neoplasm of colon: Secondary | ICD-10-CM | POA: Diagnosis not present

## 2022-08-17 DIAGNOSIS — F419 Anxiety disorder, unspecified: Secondary | ICD-10-CM | POA: Diagnosis not present

## 2022-08-17 DIAGNOSIS — F331 Major depressive disorder, recurrent, moderate: Secondary | ICD-10-CM | POA: Diagnosis not present

## 2022-08-23 DIAGNOSIS — F331 Major depressive disorder, recurrent, moderate: Secondary | ICD-10-CM | POA: Diagnosis not present

## 2022-08-23 DIAGNOSIS — Z79899 Other long term (current) drug therapy: Secondary | ICD-10-CM | POA: Diagnosis not present

## 2022-08-23 DIAGNOSIS — F411 Generalized anxiety disorder: Secondary | ICD-10-CM | POA: Diagnosis not present

## 2022-08-23 DIAGNOSIS — Z1331 Encounter for screening for depression: Secondary | ICD-10-CM | POA: Diagnosis not present

## 2022-08-23 DIAGNOSIS — F3489 Other specified persistent mood disorders: Secondary | ICD-10-CM | POA: Diagnosis not present

## 2022-08-23 DIAGNOSIS — Z1330 Encounter for screening examination for mental health and behavioral disorders, unspecified: Secondary | ICD-10-CM | POA: Diagnosis not present

## 2022-08-23 DIAGNOSIS — F439 Reaction to severe stress, unspecified: Secondary | ICD-10-CM | POA: Diagnosis not present

## 2022-08-30 DIAGNOSIS — Z1211 Encounter for screening for malignant neoplasm of colon: Secondary | ICD-10-CM | POA: Diagnosis not present

## 2022-10-12 DIAGNOSIS — F349 Persistent mood [affective] disorder, unspecified: Secondary | ICD-10-CM | POA: Diagnosis not present

## 2022-10-12 DIAGNOSIS — F331 Major depressive disorder, recurrent, moderate: Secondary | ICD-10-CM | POA: Diagnosis not present

## 2022-10-12 DIAGNOSIS — F411 Generalized anxiety disorder: Secondary | ICD-10-CM | POA: Diagnosis not present

## 2022-10-12 DIAGNOSIS — F419 Anxiety disorder, unspecified: Secondary | ICD-10-CM | POA: Diagnosis not present

## 2022-11-02 DIAGNOSIS — F3489 Other specified persistent mood disorders: Secondary | ICD-10-CM | POA: Diagnosis not present

## 2023-01-11 DIAGNOSIS — F331 Major depressive disorder, recurrent, moderate: Secondary | ICD-10-CM | POA: Diagnosis not present

## 2023-01-11 DIAGNOSIS — Z1331 Encounter for screening for depression: Secondary | ICD-10-CM | POA: Diagnosis not present

## 2023-01-11 DIAGNOSIS — F411 Generalized anxiety disorder: Secondary | ICD-10-CM | POA: Diagnosis not present

## 2023-01-11 DIAGNOSIS — F3489 Other specified persistent mood disorders: Secondary | ICD-10-CM | POA: Diagnosis not present

## 2023-01-11 DIAGNOSIS — F439 Reaction to severe stress, unspecified: Secondary | ICD-10-CM | POA: Diagnosis not present

## 2023-01-11 DIAGNOSIS — Z1330 Encounter for screening examination for mental health and behavioral disorders, unspecified: Secondary | ICD-10-CM | POA: Diagnosis not present
